# Patient Record
Sex: Female | Born: 1980 | Race: Black or African American | Hispanic: No | Marital: Single | Smoking: Current every day smoker
Health system: Southern US, Community
[De-identification: ages and names within clinical notes are randomized; demographics above are authoritative.]

## PROBLEM LIST (undated history)

## (undated) DIAGNOSIS — F410 Panic disorder [episodic paroxysmal anxiety] without agoraphobia: Secondary | ICD-10-CM

---

## 2003-08-17 ENCOUNTER — Emergency Department (HOSPITAL_COMMUNITY): Admission: EM | Admit: 2003-08-17 | Discharge: 2003-08-17 | Payer: Self-pay | Admitting: Emergency Medicine

## 2003-10-13 ENCOUNTER — Emergency Department (HOSPITAL_COMMUNITY): Admission: EM | Admit: 2003-10-13 | Discharge: 2003-10-13 | Payer: Self-pay | Admitting: Emergency Medicine

## 2003-10-31 ENCOUNTER — Emergency Department (HOSPITAL_COMMUNITY): Admission: EM | Admit: 2003-10-31 | Discharge: 2003-10-31 | Payer: Self-pay | Admitting: Family Medicine
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
## 2005-10-20 ENCOUNTER — Emergency Department (HOSPITAL_COMMUNITY): Admission: EM | Admit: 2005-10-20 | Discharge: 2005-10-21 | Payer: Self-pay | Admitting: Emergency Medicine

## 2006-09-03 ENCOUNTER — Emergency Department (HOSPITAL_COMMUNITY): Admission: EM | Admit: 2006-09-03 | Discharge: 2006-09-03 | Payer: Self-pay | Admitting: Emergency Medicine

## 2006-11-16 ENCOUNTER — Emergency Department (HOSPITAL_COMMUNITY): Admission: EM | Admit: 2006-11-16 | Discharge: 2006-11-16 | Payer: Self-pay | Admitting: Emergency Medicine

## 2007-08-03 ENCOUNTER — Emergency Department (HOSPITAL_COMMUNITY): Admission: EM | Admit: 2007-08-03 | Discharge: 2007-08-03 | Payer: Self-pay | Admitting: Emergency Medicine

## 2007-12-27 ENCOUNTER — Emergency Department (HOSPITAL_COMMUNITY): Admission: EM | Admit: 2007-12-27 | Discharge: 2007-12-27 | Payer: Self-pay | Admitting: Family Medicine

## 2008-11-15 ENCOUNTER — Emergency Department (HOSPITAL_COMMUNITY): Admission: EM | Admit: 2008-11-15 | Discharge: 2008-11-15 | Payer: Self-pay | Admitting: Emergency Medicine

## 2009-03-26 ENCOUNTER — Emergency Department (HOSPITAL_COMMUNITY): Admission: EM | Admit: 2009-03-26 | Discharge: 2009-03-26 | Payer: Self-pay | Admitting: Emergency Medicine

## 2009-10-18 IMAGING — CR DG CHEST 2V
2 series · 2 of 2 positions shown · non-contrast
Comparison: None

CLINICAL DATA: Flu-like symptoms.

CHEST - 2 VIEW

[view not recorded (1 of 2)]
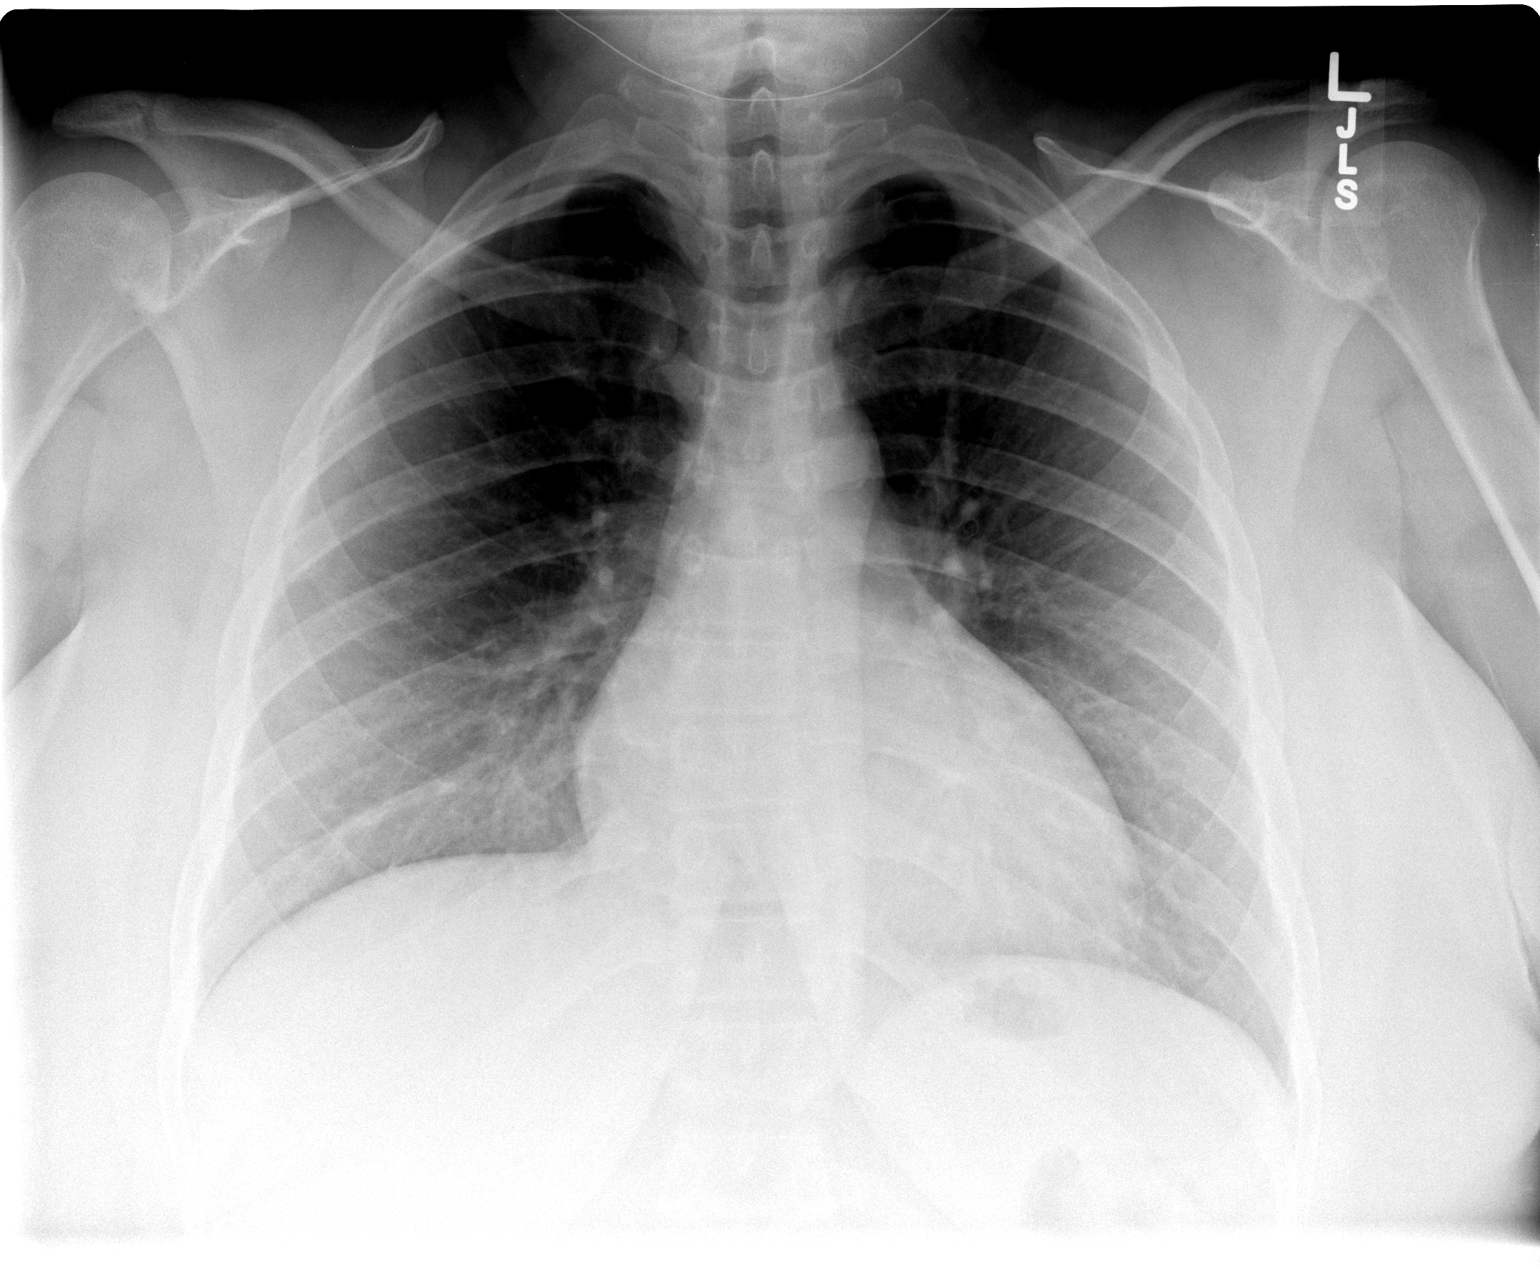

[view not recorded (2 of 2)]
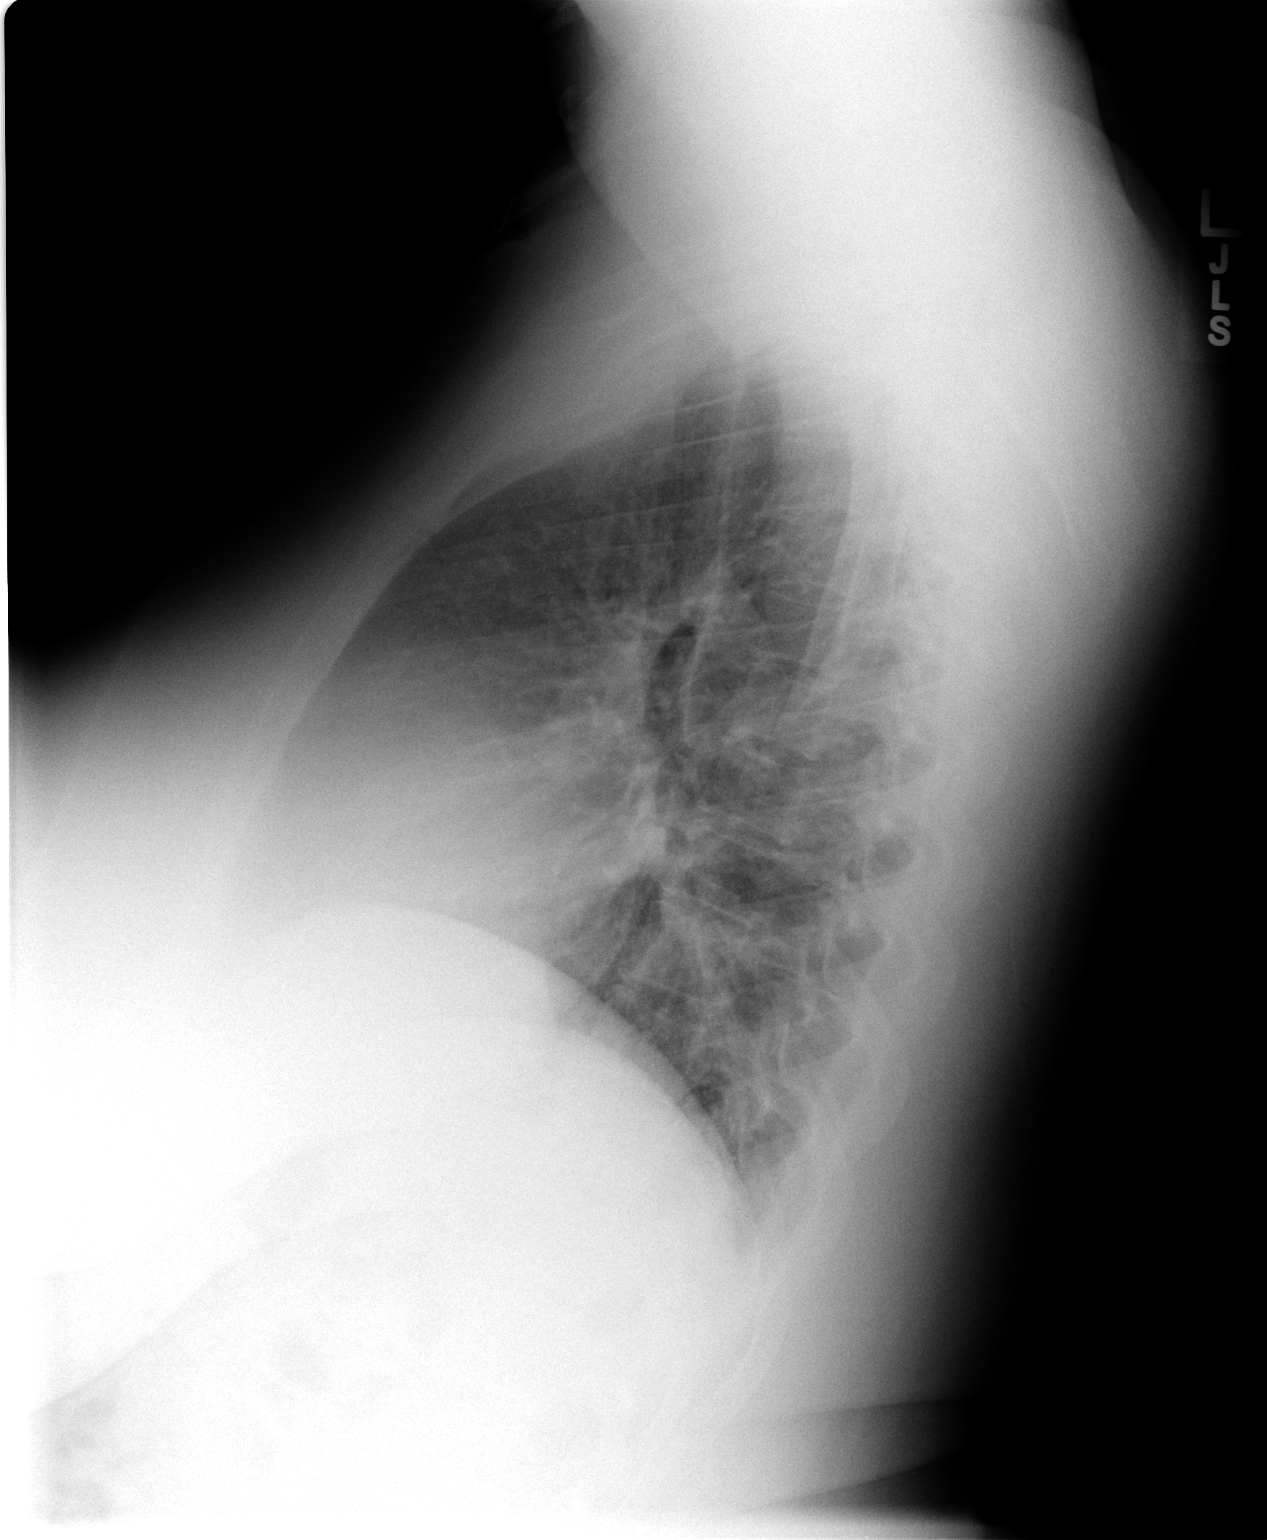

[2 of 2 positions shown; findings below may reference images not displayed]

FINDINGS: The heart size and mediastinal contours are within normal
limits.  Both lungs are clear.  The visualized skeletal structures
are unremarkable.
IMPRESSION: No acute abnormalities.

## 2010-03-20 ENCOUNTER — Inpatient Hospital Stay (HOSPITAL_COMMUNITY): Admission: AD | Admit: 2010-03-20 | Discharge: 2009-04-22 | Payer: Self-pay | Admitting: Obstetrics & Gynecology

## 2013-08-15 ENCOUNTER — Encounter (HOSPITAL_COMMUNITY): Payer: Self-pay | Admitting: Emergency Medicine

## 2013-08-15 ENCOUNTER — Emergency Department (HOSPITAL_COMMUNITY)
Admission: EM | Admit: 2013-08-15 | Discharge: 2013-08-15 | Disposition: A | Discharge status: Home / Self Care | Payer: No Typology Code available for payment source | Attending: Emergency Medicine | Admitting: Emergency Medicine

## 2013-08-15 DIAGNOSIS — IMO0002 Reserved for concepts with insufficient information to code with codable children: Secondary | ICD-10-CM | POA: Insufficient documentation

## 2013-08-15 DIAGNOSIS — M549 Dorsalgia, unspecified: Secondary | ICD-10-CM

## 2013-08-15 DIAGNOSIS — Y92410 Unspecified street and highway as the place of occurrence of the external cause: Secondary | ICD-10-CM | POA: Insufficient documentation

## 2013-08-15 DIAGNOSIS — S161XXA Strain of muscle, fascia and tendon at neck level, initial encounter: Secondary | ICD-10-CM

## 2013-08-15 DIAGNOSIS — S139XXA Sprain of joints and ligaments of unspecified parts of neck, initial encounter: Secondary | ICD-10-CM | POA: Insufficient documentation

## 2013-08-15 DIAGNOSIS — Y9389 Activity, other specified: Secondary | ICD-10-CM | POA: Insufficient documentation

## 2013-08-15 MED ORDER — DIAZEPAM 5 MG PO TABS: 5.0000 mg | ORAL_TABLET | Freq: Two times a day (BID) | ORAL | Status: DC

## 2013-08-15 MED ORDER — DIAZEPAM 5 MG PO TABS
10.0000 mg | ORAL_TABLET | Freq: Once | ORAL | Status: AC
  Administered 2013-08-15: 10 mg via ORAL
  Filled 2013-08-15: qty 2

## 2013-08-15 MED ORDER — NAPROXEN 500 MG PO TABS: 500.0000 mg | ORAL_TABLET | Freq: Two times a day (BID) | ORAL | Status: DC

## 2013-08-15 MED ORDER — NAPROXEN 500 MG PO TABS
500.0000 mg | ORAL_TABLET | Freq: Once | ORAL | Status: AC
  Administered 2013-08-15: 500 mg via ORAL
  Filled 2013-08-15: qty 1
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
## 2013-08-15 NOTE — ED Provider Notes (Signed)
Medical screening examination/treatment/procedure(s) were performed by non-physician practitioner and as supervising physician I was immediately available for consultation/collaboration.   EKG Interpretation None        Benny LennertJoseph L Flemon Kelty, MD 08/15/13 2234
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
## 2013-10-21 ENCOUNTER — Encounter (HOSPITAL_COMMUNITY): Payer: Self-pay | Admitting: Emergency Medicine

## 2013-10-21 ENCOUNTER — Emergency Department (HOSPITAL_COMMUNITY): Payer: No Typology Code available for payment source

## 2013-10-21 ENCOUNTER — Emergency Department (HOSPITAL_COMMUNITY)
Admission: EM | Admit: 2013-10-21 | Discharge: 2013-10-21 | Disposition: A | Discharge status: Home / Self Care | Payer: No Typology Code available for payment source | Attending: Emergency Medicine | Admitting: Emergency Medicine

## 2013-10-21 DIAGNOSIS — F17200 Nicotine dependence, unspecified, uncomplicated: Secondary | ICD-10-CM | POA: Insufficient documentation

## 2013-10-21 DIAGNOSIS — Z8659 Personal history of other mental and behavioral disorders: Secondary | ICD-10-CM | POA: Insufficient documentation

## 2013-10-21 DIAGNOSIS — R0789 Other chest pain: Secondary | ICD-10-CM

## 2013-10-21 DIAGNOSIS — R000 Tachycardia, unspecified: Secondary | ICD-10-CM | POA: Insufficient documentation

## 2013-10-21 DIAGNOSIS — N920 Excessive and frequent menstruation with regular cycle: Secondary | ICD-10-CM | POA: Insufficient documentation

## 2013-10-21 DIAGNOSIS — R002 Palpitations: Secondary | ICD-10-CM | POA: Insufficient documentation

## 2013-10-21 HISTORY — DX: Panic disorder (episodic paroxysmal anxiety): F41.0

## 2013-10-21 LAB — BASIC METABOLIC PANEL
ANION GAP: 15 (ref 5–15)
BUN: 9 mg/dL (ref 6–23)
CALCIUM: 8.7 mg/dL (ref 8.4–10.5)
CO2: 22 meq/L (ref 19–32)
CREATININE: 0.73 mg/dL (ref 0.50–1.10)
Chloride: 104 mEq/L (ref 96–112)
GFR calc Af Amer: >90 mL/min (ref >90)
GFR calc non Af Amer: >90 mL/min (ref >90)
GLUCOSE: 104 mg/dL — AB (ref 70–99)
POTASSIUM: 3.8 meq/L (ref 3.7–5.3)
Sodium: 141 mEq/L (ref 137–147)

## 2013-10-21 LAB — CBC
HEMATOCRIT: 28.7 % — AB (ref 36.0–46.0)
Hemoglobin: 8.1 g/dL — ABNORMAL LOW (ref 12.0–15.0)
MCH: 20.5 pg — ABNORMAL LOW (ref 26.0–34.0)
MCHC: 28.2 g/dL — ABNORMAL LOW (ref 30.0–36.0)
MCV: 72.5 fL — AB (ref 78.0–100.0)
PLATELETS: 639 10*3/uL — AB (ref 150–400)
RBC: 3.96 MIL/uL (ref 3.87–5.11)
RDW: 19.1 % — ABNORMAL HIGH (ref 11.5–15.5)
WBC: 8.9 10*3/uL (ref 4.0–10.5)

## 2013-10-21 LAB — I-STAT TROPONIN, ED
TROPONIN I, POC: 0 ng/mL (ref 0.00–0.08)
Troponin i, poc: 0 ng/mL (ref 0.00–0.08)

## 2013-10-21 LAB — D-DIMER, QUANTITATIVE (NOT AT ARMC): D DIMER QUANT: 0.45 ug{FEU}/mL (ref 0.00–0.48)

## 2013-10-21 MED ORDER — FERROUS SULFATE 325 (65 FE) MG PO TABS: 325.0000 mg | ORAL_TABLET | Freq: Every day | ORAL | Status: AC

## 2013-10-21 NOTE — ED Provider Notes (Signed)
CSN: [id number]     Arrival date & time 10/21/13  1546 History   First MD Initiated Contact with Patient 10/21/13 1751     Chief Complaint  Patient presents with  . Chest Pain     (Consider location/radiation/quality/duration/timing/severity/associated sxs/prior Treatment) HPI Presents with anterior chest pain described as a "fluttering accompanied by mild shortness of breath and palpitations onset 3 PM today. Symptoms improving steadily with time. Patient states she gets chest pain approximately twice per week which is usually made worse with emotional stress and lack of sleep. However "not bad" symptoms are mild at present. No treatment prior to coming here. Patient drinks a lot of caffeinated beverages including Dara LordsMountain Dew Past Medical History  Diagnosis Date  . Panic attacks    break risk factors smoker family history History reviewed. No pertinent past surgical history. History reviewed. No pertinent family history. History  Substance Use Topics  . Smoking status: Current Every Day Smoker    Types: Cigarettes  . Smokeless tobacco: Not on file  . Alcohol Use: No   no drug use OB History   Grav Para Term Preterm Abortions TAB SAB Ect Mult Living                 Review of Systems  Constitutional: Negative.   HENT: Negative.   Respiratory: Positive for shortness of breath.   Cardiovascular: Positive for chest pain and palpitations.  Gastrointestinal: Negative.   Genitourinary: Positive for menstrual problem.       Menorrhagia  Musculoskeletal: Negative.   Skin: Negative.   Neurological: Negative.   Psychiatric/Behavioral: Negative.   All other systems reviewed and are negative.     Allergies  Review of patient's allergies indicates no known allergies.  Home Medications   Prior to Admission medications   Medication Sig Start Date End Date Taking? Authorizing Provider  acetaminophen (TYLENOL) 325 MG tablet Take 650 mg by mouth every 6 (six) hours as needed.    Yes Historical Provider, MD   BP 132/65  Pulse 78  Temp(Src) 98.6 F (37 C) (Oral)  Resp 22  SpO2 100%  LMP 10/15/2013 Physical Exam  Nursing note and vitals reviewed. Constitutional: She appears well-developed and well-nourished.  HENT:  Head: Normocephalic and atraumatic.  Eyes: Conjunctivae are normal. Pupils are equal, round, and reactive to light.  Neck: Neck supple. No tracheal deviation present. No thyromegaly present.  Cardiovascular: Regular rhythm.   No murmur heard. Mildly tachychycardic  Pulmonary/Chest: Effort normal and breath sounds normal.  Abdominal: Soft. Bowel sounds are normal. She exhibits no distension. There is no tenderness.  Musculoskeletal: Normal range of motion. She exhibits no edema and no tenderness.  Neurological: She is alert. Coordination normal.  Skin: Skin is warm and dry. No rash noted.  Psychiatric: She has a normal mood and affect.    ED Course  Procedures (including critical care time) Labs Review Labs Reviewed  CBC - Abnormal; Notable for the following:    Hemoglobin 8.1 (*)    HCT 28.7 (*)    MCV 72.5 (*)    MCH 20.5 (*)    MCHC 28.2 (*)    RDW 19.1 (*)    Platelets 639 (*)    All other components within normal limits  BASIC METABOLIC PANEL - Abnormal; Notable for the following:    Glucose, Bld 104 (*)    All other components within normal limits  Rosezena SensorI-STAT TROPOININ, ED    Imaging Review Dg Chest 2 View  10/21/2013  CLINICAL DATA:  Chest pain for 2 days.  Shortness of breath.  EXAM: CHEST  2 VIEW  COMPARISON:  PA and lateral chest 12/27/2007.  FINDINGS: Heart size and mediastinal contours are within normal limits. Both lungs are clear. Visualized skeletal structures are unremarkable.  IMPRESSION: Negative exam.   Electronically Signed   By: Drusilla Kanner M.D.   On: 10/21/2013 16:26     EKG Interpretation   Date/Time:  Saturday October 21 2013 15:49:55 EDT Ventricular Rate:  111 PR Interval:  146 QRS Duration: 76 QT  Interval:  332 QTC Calculation: 451 R Axis:   90 Text Interpretation:  Sinus tachycardia Rightward axis Nonspecific T wave  abnormality Abnormal ECG No significant change since last tracing  Confirmed by Ethelda Chick  MD, Vandana Haman [phone]) on 10/21/2013 5:56:52 PM     7:40 PM patient asymptomatic Chest x-ray viewed by me Results for orders placed during the hospital encounter of 10/21/13  CBC      Result Value Ref Range   WBC 8.9  4.0 - 10.5 K/uL   RBC 3.96  3.87 - 5.11 MIL/uL   Hemoglobin 8.1 (*) 12.0 - 15.0 g/dL   HCT 60.4 (*) 54.0 - 98.1 %   MCV 72.5 (*) 78.0 - 100.0 fL   MCH 20.5 (*) 26.0 - 34.0 pg   MCHC 28.2 (*) 30.0 - 36.0 g/dL   RDW 19.1 (*) 47.8 - 29.5 %   Platelets 639 (*) 150 - 400 K/uL  BASIC METABOLIC PANEL      Result Value Ref Range   Sodium 141  137 - 147 mEq/L   Potassium 3.8  3.7 - 5.3 mEq/L   Chloride 104  96 - 112 mEq/L   CO2 22  19 - 32 mEq/L   Glucose, Bld 104 (*) 70 - 99 mg/dL   BUN 9  6 - 23 mg/dL   Creatinine, Ser 6.21  0.50 - 1.10 mg/dL   Calcium 8.7  8.4 - 30.8 mg/dL   GFR calc non Af Amer >90  >90 mL/min   GFR calc Af Amer >90  >90 mL/min   Anion gap 15  5 - 15  D-DIMER, QUANTITATIVE      Result Value Ref Range   D-Dimer, Quant 0.45  0.00 - 0.48 ug/mL-FEU  I-STAT TROPOININ, ED      Result Value Ref Range   Troponin i, poc 0.00  0.00 - 0.08 ng/mL   Comment 3           I-STAT TROPOININ, ED      Result Value Ref Range   Troponin i, poc 0.00  0.00 - 0.08 ng/mL   Comment 3            Dg Chest 2 View  10/21/2013   CLINICAL DATA:  Chest pain for 2 days.  Shortness of breath.  EXAM: CHEST  2 VIEW  COMPARISON:  PA and lateral chest 12/27/2007.  FINDINGS: Heart size and mediastinal contours are within normal limits. Both lungs are clear. Visualized skeletal structures are unremarkable.  IMPRESSION: Negative exam.   Electronically Signed   By: Drusilla Kanner M.D.   On: 10/21/2013 16:26    MDM  Heart score equals 1 Final diagnoses:  None   strongly  doubt acute coronary syndrome in this young menstruating female with atypical story, and negative serial troponins and no acute EKG. Doubt pulmonary embolism while score for PE is 1.5 with negative d-dimer Plan prescription for iron sulfate this patient is anemic  Spent 5 minutes counseling the patient on smoking cessation Referral resource guide, wellness Center, [facility] Advised to avoid caffeinated beverages Diagnosis #1 atypical chest pain #2 palpitations #3 anemia #4 tobacco abuse  Doug Sou, MD 10/21/13 1955

## 2013-10-21 NOTE — ED Notes (Signed)
Pt reports onset yesterday of left side sharp chest pains, they resolved and returned again today. Pt reports palpitations and chills, feels anxious. No acute distress noted at triage. Hx of panic attacks.

## 2013-10-21 NOTE — Discharge Instructions (Signed)
Chest Pain (Nonspecific) Call the wellness Center or any of the numbers on the resource guide to get a primary care physician. Ask your new doctor to help you to stop smoking. Call the [facility] getting gynecologist.Tell gynecologist about your heavy menstrual periods. Your blood count should be rechecked in a month. Your hemoglobin was low today at 8.1 It is often hard to give a diagnosis for the cause of chest pain. There is always a chance that your pain could be related to something serious, such as a heart attack or a blood clot in the lungs. You need to follow up with your doctor. HOME CARE  If antibiotic medicine was given, take it as directed by your doctor. Finish the medicine even if you start to feel better.  For the next few days, avoid activities that bring on chest pain. Continue physical activities as told by your doctor.  Do not use any tobacco products. This includes cigarettes, chewing tobacco, and e-cigarettes.  Avoid drinking alcohol.  Only take medicine as told by your doctor.  Follow your doctor's suggestions for more testing if your chest pain does not go away.  Keep all doctor visits you made. GET HELP IF:  Your chest pain does not go away, even after treatment.  You have a rash with blisters on your chest.  You have a fever. GET HELP RIGHT AWAY IF:   You have more pain or pain that spreads to your arm, neck, jaw, back, or belly (abdomen).  You have shortness of breath.  You cough more than usual or cough up blood.  You have very bad back or belly pain.  You feel sick to your stomach (nauseous) or throw up (vomit).  You have very bad weakness.  You pass out (faint).  You have chills. This is an emergency. Do not wait to see if the problems will go away. Call your local emergency services (911 in U.S.). Do not drive yourself to the hospital. MAKE SURE YOU:   Understand these instructions.  Will watch your condition.  Will get help  right away if you are not doing well or get worse. Document Released: 09/16/2007 Document Revised: 04/04/2013 Document Reviewed: 09/16/2007 [employer] Patient Information 2015 [place], [place]. This information is not intended to replace advice given to you by your health care provider. Make sure you discuss any questions you have with your health care provider.  Emergency Department Resource Guide 1) Find a Doctor and Pay Out of Pocket Although you won't have to find out who is covered by your insurance plan, it is a good idea to ask around and get recommendations. You will then need to call the office and see if the doctor you have chosen will accept you as a new patient and what types of options they offer for patients who are self-pay. Some doctors offer discounts or will set up payment plans for their patients who do not have insurance, but you will need to ask so you aren't surprised when you get to your appointment.  2) Contact Your Local Health Department Not all health departments have doctors that can see patients for sick visits, but many do, so it is worth a call to see if yours does. If you don't know where your local health department is, you can check in your phone book. The CDC also has a tool to help you locate your state's health department, and many state websites also have listings of all of their local health departments.  3) Find a Walk-in Clinic If your illness is not likely to be very severe or complicated, you may want to try a walk in clinic. These are popping up all over the country in pharmacies, drugstores, and shopping centers. They're usually staffed by nurse practitioners or physician assistants that have been trained to treat common illnesses and complaints. They're usually fairly quick and inexpensive. However, if you have serious medical issues or chronic medical problems, these are probably not your best option.  No Primary Care Doctor: - Call Health Connect at   [phone] - they can help you locate a primary care doctor that  accepts your insurance, provides certain services, etc. - Physician Referral Service- [phone]  Chronic Pain Problems: Organization         Address  Phone   Notes  [facility]  [phone] Patients need to be referred by their primary care doctor.   Medication Assistance: Organization         Address  Phone   Notes  [facility] Medication [facility] [address] [place], [place] [address] [phone] --Must be a resident of [facility] -- Must have NO insurance coverage whatsoever (no Medicaid/ Medicare, etc.) -- The pt. MUST have a primary care doctor that directs their care regularly and follows them in the community   [facility]ist  [phone]   [employer]  [phone]    Agencies that provide inexpensive medical care: Organization         Address  Phone   Notes  [facility]  [phone]   [facility]    [phone]   [facility] [address] [place], [place] [address] [phone]   [facility] [address] [place] [phone]   Planned Parenthood    [phone]   [facility]    [phone]   [facility]  [address], [place] Phone:  [phone], Fax:  [phone] Hours of Operation:  9 am - 6 pm, M-F.  Also accepts Medicaid/Medicare and self-pay.  [facility]  [address], [place] Phone: [phone], Fax: [phone]. Hours of Operation:  8:30 am - 5:30 pm, M-F.  Also accepts Medicaid and self-pay.  [facility] [place] [address] [place] Phone: [phone]   [facility] [address] Natasha Bence [place], [place] [phone], Ext. [phone] Mondays & Thursdays: 7-9 AM.  First 15 patients are seen on a first come, first serve basis.     Medicaid-accepting [facility] Providers:  Organization         Address  Phone   Notes  [facility] [address],  [phone] Also accepts self-pay patients.  [facility] [address] Laurell Josephs [place], [place]  [phone]   [facility] [address], [place] [phone]   [facility] [address] [place] [phone]   Renaye Rakers [address], [place]   [phone] Only accepts [place] patients after they have their name applied to their card.   Self-Pay (no insurance) in [facility]:  Organization         Address  Phone   Notes  Sickle Cell Patients, [facility] [phone]  Vaughan Basta [place], [place] [phone]   [facility] [address] [place], [place] [phone]   [facility] [place]  [address], [place] [phone]   [facility]/Dr. Osei-Bonsu  [address], [place] or [address], [place] [phone] Phone number for both [place] and [place] locations is the same.  [facility] [address] [place] [phone]   [facility] [address] [place] or [address] [phone]   [facility] [address] [place] [phone], phone; [phone], fax Sees patients 1st and 3rd Saturday of every month.  Must not qualify for public or private insurance (i.e. Medicaid, Medicare, [place] Health Choice, Veterans' Benefits)  Household income should be no more than 200% of the poverty level The clinic cannot treat you if you are pregnant or think you are pregnant  Sexually transmitted diseases are not treated at the clinic.    Dental Care: Organization         Address  Phone  Notes  [facility]  Department of [facility] [address] [place], [place] [phone] Accepts children up to age 56 who are enrolled in [place] or [place] Health Choice; pregnant women with a Medicaid card; and children who have applied for Medicaid or [place] Health Choice, but were declined, whose parents can pay a reduced fee at time of service.  [facility] Department of [facility]  [address], [place] [phone] Accepts children up to age 73 who are enrolled in [place] or [place] Health Choice; pregnant women with a Medicaid card; and children who have applied for Medicaid or [place] Health Choice, but were declined, whose parents can pay a reduced fee at time of service.  [facility]  [address] [place], [place] [phone] Patients are seen by appointment only. Walk-ins are not accepted. [facility] will see patients 70 years of age and older. Monday - Tuesday (8am-5pm) Most Wednesdays (8:30-5pm) $30 per visit, cash only  [facility]  [address], [facility] [phone] Patients are seen by appointment only. Walk-ins are not accepted. [facility] will see patients 58 years of age and older. One Wednesday Evening (Monthly: Volunteer Based).  $30 per visit, cash only  [employer]  [phone] for adults; Children under age 53, call Graduate Pediatric Dentistry at [phone]. Children aged 17-14, please call [phone] to request a pediatric application.  Dental services are provided in all areas of dental care including fillings, crowns and bridges, complete and partial dentures, implants, gum treatment, root canals, and extractions. Preventive care is also provided. Treatment is provided to both adults and children. Patients are selected via a lottery and there is often a waiting list.   [facility] [address] [place]  [phone]  [email or web address]   [facility] [address] [place], [place] [phone], Ext. [phone] Second and Fourth Thursday of each month, opens at 6:30 AM; Clinic ends at 9 AM.  Patients are seen on a first-come first-served basis, and a limited number are seen during each clinic.   [facility]  [address] Ether Griffins [place], [place] [phone]   Eligibility Requirements You must have lived in [place], or [place] counties for at least the last three months.   You cannot be eligible for state or federal sponsored National Cityhealthcare insurance, including CIGNAVeterans Administration, [place]icaid, or [employer]icare.   You generally cannot be eligible for healthcare insurance through your employer.    How to apply: Eligibility screenings are held every Tuesday and Wednesday afternoon from 1:00 pm until 4:00 pm. You do not need an appointment for the interview!  [facility] [address], [place], [place] [phone]   [facility]  [phone]   [facility]  [phone]   [facility]  [phone]    Behavioral Health Resources in the Community: Intensive Outpatient Programs Organization         Address  Phone  Notes  [facility] [address], [place], [place] [phone]   [facility] [address], [place], [place] [phone]   [facility] [address], [place], [place]  [phone]   [facility] [address],  [place], [place] [phone] or [phone]   Substance Abuse Resources Organization         Address  Phone  Notes  [facility]  [phone]   [facility]  [phone]   [facility]  [phone]   [facility]  [phone]   Residential & Outpatient Substance Abuse Program  [phone]   Psychological Services Organization          Address  Phone  Notes  [facility]  [phone]- [phone]   [facility]  [phone]- [phone]   [facility] [address], [place] [phone] or [phone]    Mobile Crisis Teams Organization         Address  Phone  Notes  [facility], Mobile Crisis Care Unit  [phone]   [facility]  [address] [place], [place] [phone]   Doristine LocksSharon DeEsch [address] [place] [phone]    Self-Help/Support Groups Organization         Address  Phone             Notes  [facility] [place] - variety of support groups  336- [phone] Call for more information  Narcotics Anonymous (NA), [facility] [address], [place]  2 meetings at this location   Statisticianesidential Treatment Programs Organization         Address  Phone  Notes  [facility] [address],    [place]  [phone]   [facility]  [address], [place] [phone], [place], [place] [phone]   [facility] [address], [place] [phone] Admissions: 8am-3pm M-F  [facility] [address]    [place], [place] [phone]   [facility] [address], [place], [place] [phone]   [facility] [address]  [place], [place] [phone]   [facility] - Intensive Outpatient [address], [place], [place] [phone]   [facility] (Add[facility] Recovery [facility] [address]  [place], [place] [phone] or [phone]   [facility] (RT[facility]) [address] [place], [place] [phone] Accepts Medicaid  [facility] [address]  [place] [phone] Substance Abuse/Addiction Treatment   [facility] Resources Organization         Address  Phone  Notes  [facility]  [phone]   Angie FavaJulie Brannon, PhD [address], [address] [place], [place]   [phone] or [phone]   [facility]   [address] [place], [place] [phone]   [facility] [address], [place], [place] [phone] Insurance/Medicaid/sponsorship through [facility]  [facility] Families [address]                                    [place], [place] [phone] Therapy/tele-psych/case  [facility] [address]   [place], [place] [phone]    Dr. Lolly MustacheArfeen  [phone]   [facility]  [employer] [facility] 1) [address], [place] 2) [address], [place] 3)  [address], [place] [phone]  [phone]) [phone]   [facility] Child Abuse Hotline [phone] or [phone]) [phone] (After Hours)

## 2014-08-14 ENCOUNTER — Emergency Department (HOSPITAL_COMMUNITY)
Admission: EM | Admit: 2014-08-14 | Discharge: 2014-08-14 | Disposition: A | Discharge status: Home / Self Care | Payer: Self-pay | Attending: Emergency Medicine | Admitting: Emergency Medicine

## 2014-08-14 ENCOUNTER — Encounter (HOSPITAL_COMMUNITY): Payer: Self-pay

## 2014-08-14 DIAGNOSIS — K029 Dental caries, unspecified: Secondary | ICD-10-CM | POA: Insufficient documentation

## 2014-08-14 DIAGNOSIS — Z79899 Other long term (current) drug therapy: Secondary | ICD-10-CM | POA: Insufficient documentation

## 2014-08-14 DIAGNOSIS — Z720 Tobacco use: Secondary | ICD-10-CM | POA: Insufficient documentation

## 2014-08-14 DIAGNOSIS — Z8659 Personal history of other mental and behavioral disorders: Secondary | ICD-10-CM | POA: Insufficient documentation

## 2014-08-14 MED ORDER — NAPROXEN 500 MG PO TABS
500.0000 mg | ORAL_TABLET | Freq: Once | ORAL | Status: DC
  Filled 2014-08-14: qty 1

## 2014-08-14 MED ORDER — PENICILLIN V POTASSIUM 500 MG PO TABS
500.0000 mg | ORAL_TABLET | Freq: Once | ORAL | Status: DC
  Filled 2014-08-14: qty 1

## 2014-08-14 MED ORDER — PENICILLIN V POTASSIUM 500 MG PO TABS: 500.0000 mg | ORAL_TABLET | Freq: Four times a day (QID) | ORAL | Status: AC

## 2014-08-14 MED ORDER — NAPROXEN 375 MG PO TABS: 375.0000 mg | ORAL_TABLET | Freq: Two times a day (BID) | ORAL | Status: AC

## 2014-08-14 NOTE — ED Notes (Signed)
Patient is alert and oriented x3.  She was given DC instructions and follow up visit instructions.  Patient gave verbal understanding. She was DC ambulatory under her own power to home.  V/S stable.  He was not showing any signs of distress on DC 

## 2014-08-14 NOTE — ED Notes (Signed)
Patient reports she has left-sided dental pain x 2 days, with swelling.

## 2014-08-14 NOTE — Discharge Instructions (Signed)
Dental Caries °Dental caries is tooth decay. This decay can cause a hole in teeth (cavity) that can get bigger and deeper over time. °HOME CARE °· Brush and floss your teeth. Do this at least two times a day. °· Use a fluoride toothpaste. °· Use a mouth rinse if told by your dentist or doctor. °· Eat less sugary and starchy foods. Drink less sugary drinks. °· Avoid snacking often on sugary and starchy foods. Avoid sipping often on sugary drinks. °· Keep regular checkups and cleanings with your dentist. °· Use fluoride supplements if told by your dentist or doctor. °· Allow fluoride to be applied to teeth if told by your dentist or doctor. °Document Released: 01/07/2008 Document Revised: 08/14/2013 Document Reviewed: 04/01/2012 °ExitCare® Patient Information ©2015 [employer]. This information is not intended to replace advice given to you by your health care provider. Make sure you discuss any questions you have with your health care provider. ° °Emergency Department Resource Guide °1) Find a Doctor and Pay Out of Pocket °Although you won't have to find out who is covered by your insurance plan, it is a good idea to ask around and get recommendations. You will then need to call the office and see if the doctor you have chosen will accept you as a new patient and what types of options they offer for patients who are self-pay. Some doctors offer discounts or will set up payment plans for their patients who do not have insurance, but you will need to ask so you aren't surprised when you get to your appointment. ° °2) Contact Your Local Health Department °Not all health departments have doctors that can see patients for sick visits, but many do, so it is worth a call to see if yours does. If you don't know where your local health department is, you can check in your phone book. The CDC also has a tool to help you locate your state's health department, and many state websites also have listings of all of their local  health departments. ° °3) Find a Walk-in Clinic °If your illness is not likely to be very severe or complicated, you may want to try a walk in clinic. These are popping up all over the country in pharmacies, drugstores, and shopping centers. They're usually staffed by nurse practitioners or physician assistants that have been trained to treat common illnesses and complaints. They're usually fairly quick and inexpensive. However, if you have serious medical issues or chronic medical problems, these are probably not your best option. ° °No Primary Care Doctor: °- Call Health Connect at  832-8000 - they can help you locate a primary care doctor that  accepts your insurance, provides certain services, etc. °- Physician Referral Service- 1-800-533-3463 ° °Chronic Pain Problems: °Organization         Address  Phone   Notes  °[facility]  (336) 297-2271 Patients need to be referred by their primary care doctor.  ° °Medication Assistance: °Organization         Address  Phone   Notes  °[place] [facility] [address] °[place], [place] [address] (336) 641-8030 --Must be a resident of [place] °-- Must have NO insurance coverage whatsoever (no Medicaid/ Medicare, etc.) °-- The pt. MUST have a primary care doctor that directs their care regularly and follows them in the community °  °[facility]  (866) 331-1348   °[employer]  (888) 892-1162   ° °Agencies that   provide inexpensive medical care: Organization         Address  Phone   Notes  [place]  [phone]   [facility]    [phone]   [facility] [place], [place] [address] [phone]   [place] [address], [place] (941) [phone]   [facility]    979-100-4096   [facility]    [phone]   [place] [address], [place] Phone:   [phone], Fax:  [phone] Hours of Operation:  9 am - 6 pm, M-F.  Also accepts Medicaid/Medicare and self-pay.  [facility] for [place] [address], [address], [place] Phone: [phone], Fax: [phone]. Hours of Operation:  8:30 am - 5:30 pm, M-F.  Also accepts Medicaid and self-pay.  [facility] [place] [address], [place] Phone: [phone]   [place], [place], [place] [phone], Ext. [phone] Mondays & Thursdays: 7-9 AM.  First 15 patients are seen on a first come, first serve basis.    [facility] Providers:  Organization         Address  Phone   Notes  [facility] [address], [place] [phone] Also accepts self-pay patients.  [facility] [address], [place]  [phone]   [place], [address], [place] [phone]   [facility] [address], [place] [phone]   Lucianne Lei [address], [place]   ([phone] Only accepts [place] Access [place] patients after they have their name applied to their card.   Self-Pay (no insurance) in [facility]:  Organization         Address  Phone   Notes  Sickle Cell Patients, [facility] [place] [phone]   [facility] [place] [phone]   [facility] [place]  [place], [place], [place] [phone]   [facility]/Dr. Osei-Bonsu  [address], [place] or [address], [place] [phone] Phone number for both [place] and [place] locations is the same.  [facility] [address], [place] [phone]   [facility] [address] [place] or [address] [phone]   [facility] [address], [place] [phone], phone; [phone], fax Sees patients 1st and 3rd Saturday of every month.  Must not qualify for public or private insurance (i.e. Medicaid, Medicare, [place] Health Choice, Veterans' Benefits)  Household income should be no more than 200% of the poverty level The clinic cannot treat you if you are pregnant or think you are pregnant  Sexually transmitted diseases are not treated at the clinic.    Dental Care: Organization         Address  Phone  Notes  [facility] [place] [phone] Accepts children up to age 50 who are enrolled in [place] or [place]; pregnant women with a Medicaid card; and children who have applied for Medicaid or [place]  Choice, but were declined, whose parents can pay a reduced fee at time of service.  °[place] [facility] [place]  [address], [place] (336) 641-7733 Accepts children up to age 21 who are enrolled in Medicaid or [place] Health Choice; pregnant women with a Medicaid card; and children who have applied for Medicaid or [place] Health Choice, but were declined, whose parents can pay a reduced fee at time of service.  °[facility] ° [address], [place] (336) 641-4533 Patients are seen by appointment only. Walk-ins are not accepted. [facility] will see patients 18 years of age and older. °Monday - Tuesday (8am-5pm) °Most Wednesdays (8:30-5pm) °$30 per visit, cash only  °[facility] ° [address], [place] (336) 641-4533 Patients are seen by appointment only. Walk-ins are not accepted. [facility] will see patients 18 years of age and older. °One Wednesday Evening (Monthly: Volunteer Based).  $30 per visit, cash only  °[facility]  ([phone]) [phone] for adults;  Children under age 4, call Graduate Pediatric Dentistry at [phone]. Children aged 4-14, please call [phone]) [phone] to request a pediatric application. ° Dental services are provided in all areas of dental care including fillings, crowns and bridges, complete and partial dentures, implants, gum treatment, root canals, and extractions. Preventive care is also provided. Treatment is provided to both adults and children. °Patients are selected via a lottery and there is often a waiting list. °  °[facility] [address], °[place] ° (336) 763-8833 [email or web address] °  °[facility] [address], [place], [place] (336)723-1848, Ext. [phone] Second and Fourth Thursday of each month, opens at 6:30 AM; Clinic ends at 9 AM.  Patients are seen on a first-come first-served basis, and a limited number are seen during each clinic.  ° °[facility] ° [address], [place], [place] (336) [phone]   Eligibility Requirements °You must have lived in [place], [place], or [place] counties for at least the last three months. °  You cannot be eligible for state or federal sponsored healthcare insurance, including Veterans Administration, Medicaid, or Medicare. °  You generally cannot be eligible for healthcare insurance through your employer.  °  How to apply: °Eligibility screenings are held every Tuesday and Wednesday afternoon from 1:00 pm until 4:00 pm. You do not need an appointment for the interview!  °[facility] [address], [place], [place] [phone]   °[place] [facility]  336-342-8273   °[place] [facility]  [phone]   °[place] [facility]  336-570-6415   ° °Behavioral Health Resources in the Community: °Intensive Outpatient Programs °Organization         Address  Phone  Notes  °[facility] [address], [place], [place] 336-878-6098   °[facility] [address], [place], [place] 336-832-9800   °[facility] [address], [place], [place] ° 336-882-2125   °[place] [address],  °[place], [place] 1-800-853-5163 or 336-641-4981   °Substance Abuse Resources °Organization         Address  Phone  Notes  °[facility]  336-882-2125   °[facility]  336-784-9470   °[facility]  336-285-9073   °[facility]  336-845-3988   °Residential & Outpatient Substance Abuse Program  1-800-659-3381   °  Psychological Services °Organization         Address  Phone  Notes  °[facility]  336- 832-9600   °[facility]  336- 378-7881   °[facility] [address], [place] 1-800-853-5163 or 336-641-4981   ° °Mobile Crisis Teams °Organization         Address  Phone  Notes  °[facility], Mobile Crisis Care Unit  1-877-626-1772   °Assertive °[facility] ° [address] [place], [place] 336-834-9664   °Sharon DeEsch [address] °[place] 336-554-5454   ° °Self-Help/Support Groups °Organization         Address  Phone             Notes  °[facility] [place] - variety of support groups  336- 373-1402 Call for more information  °Narcotics Anonymous (NA), [facility] [address], °[place]  2 meetings at this location  ° °Residential Treatment Programs °Organization         Address  Phone  Notes  °[facility] [address],    °[place]  1-866-801-8205   °[facility] ° [address], [place], [place] [phone]   °[facility] [address], [place] 336-845-3988 Admissions: 8am-3pm M-F  °[facility] [address]    °[place], [place] 336-841-1104   °[facility] [address], [place], [place] 336-379-7146   °[facility] [address]  °[place], [place] 336-285-9073   °[facility] - Intensive  Outpatient [address], [place], [place] 336-852-3033   °[facility] [address]  °[place], [place] 1-877-615-2722 or 336-784-9470   °[facility] ([facility]) [address] [place], [place] 336-227-7417 Accepts Medicaid  °[facility] [address]  ° [place] 1-800-659-3381 Substance Abuse/Addiction Treatment  ° °[place] [facility] Resources °Organization         Address  Phone  Notes  °[facility]  (888) 581-9988   °Julie Brannon, PhD [address] [place], [place]   (336) 349-5553 or (336) 951-0000   °[facility]   [address] °[place], [place] (336) 349-4454   °[facility] [address], [place], [place] (336) 342-8316 Insurance/Medicaid/sponsorship through [facility]  °[facility] [address]                                    [place], [place] (336) 342-8316 Therapy/tele-psych/case  °[facility] [address]  ° [place], [place] (336) 349-2233    °Dr. Arfeen  (336) 349-4544   °[facility] [place]  [employer] [place] [facility] 1) [address], [place] °2) [address], [place] °3)  [address], [place] (336) 349-3220 °(336) 342-7768 ° °(336) 342-8140   °[place] Child Abuse Hotline (336) 342-1394 or (336) 342-3537 (After Hours)    ° ° ° °

## 2014-08-14 NOTE — ED Provider Notes (Signed)
CSN: [id number]     Arrival date & time 08/14/14  0555 History   First MD Initiated Contact with Patient 08/14/14 [phone]     Chief Complaint  Patient presents with  . Dental Pain     (Consider location/radiation/quality/duration/timing/severity/associated sxs/prior Treatment) Patient is a 34 y.o. female presenting with tooth pain. The history is provided by the patient.  Dental Pain Location:  Lower Lower teeth location:  19/LL 1st molar Quality:  Dull Severity:  Severe Onset quality:  Gradual Duration:  2 days Timing:  Constant Progression:  Unchanged Chronicity:  New Context: dental caries and poor dentition   Context: not trauma   Previous work-up:  Dental exam Relieved by:  Nothing Worsened by:  Nothing tried Ineffective treatments:  None tried Associated symptoms: no congestion, no drooling, no facial swelling, no fever and no trismus   Risk factors: no alcohol problem     Past Medical History  Diagnosis Date  . Panic attacks    History reviewed. No pertinent past surgical history. History reviewed. No pertinent family history. History  Substance Use Topics  . Smoking status: Current Every Day Smoker    Types: Cigarettes  . Smokeless tobacco: Not on file  . Alcohol Use: No   OB History    No data available     Review of Systems  Constitutional: Negative for fever.  HENT: Positive for dental problem. Negative for congestion, drooling and facial swelling.   All other systems reviewed and are negative.     Allergies  Review of patient's allergies indicates no known allergies.  Home Medications   Prior to Admission medications   Medication Sig Start Date End Date Taking? Authorizing Provider  acetaminophen (TYLENOL) 325 MG tablet Take 650 mg by mouth every 6 (six) hours as needed.    Historical Provider, MD  ferrous sulfate 325 (65 FE) MG tablet Take 1 tablet (325 mg total) by mouth daily. 10/21/13   Doug SouSam Jacubowitz, MD   BP 126/69 mmHg  Pulse 85   Temp(Src) 98.9 F (37.2 C) (Oral)  Resp 18  SpO2 100%  LMP 08/10/2014 (Approximate) Physical Exam  Constitutional: She is oriented to person, place, and time. She appears well-developed and well-nourished. No distress.  HENT:  Head: Normocephalic and atraumatic.  Mouth/Throat: No trismus in the jaw. No oropharyngeal exudate.    Eyes: Conjunctivae are normal. Pupils are equal, round, and reactive to light.  Neck: Normal range of motion. Neck supple. No tracheal deviation present.  Cardiovascular: Normal rate and regular rhythm.   Pulmonary/Chest: Effort normal and breath sounds normal. No respiratory distress. She has no wheezes. She has no rales.  Abdominal: Soft. Bowel sounds are normal. There is no tenderness. There is no rebound and no guarding.  Musculoskeletal: Normal range of motion.  Neurological: She is alert and oriented to person, place, and time.  Skin: Skin is warm and dry.  Psychiatric: She has a normal mood and affect.    ED Course  Procedures (including critical care time) Labs Review Labs Reviewed - No data to display  Imaging Review No results found.   EKG Interpretation None      MDM   Final diagnoses:  None   Penicillin VK and referral to dentistry   Crystalann Korf, MD 08/14/14 [phone]

## 2014-10-15 ENCOUNTER — Emergency Department (HOSPITAL_COMMUNITY): Payer: Self-pay

## 2014-10-15 ENCOUNTER — Emergency Department (HOSPITAL_COMMUNITY)
Admission: EM | Admit: 2014-10-15 | Discharge: 2014-10-15 | Disposition: A | Discharge status: Home / Self Care | Payer: Self-pay | Attending: Emergency Medicine | Admitting: Emergency Medicine

## 2014-10-15 ENCOUNTER — Encounter (HOSPITAL_COMMUNITY): Payer: Self-pay | Admitting: Emergency Medicine

## 2014-10-15 DIAGNOSIS — F419 Anxiety disorder, unspecified: Secondary | ICD-10-CM | POA: Insufficient documentation

## 2014-10-15 DIAGNOSIS — M25519 Pain in unspecified shoulder: Secondary | ICD-10-CM | POA: Insufficient documentation

## 2014-10-15 DIAGNOSIS — R079 Chest pain, unspecified: Secondary | ICD-10-CM

## 2014-10-15 DIAGNOSIS — R0789 Other chest pain: Secondary | ICD-10-CM | POA: Insufficient documentation

## 2014-10-15 DIAGNOSIS — Z79899 Other long term (current) drug therapy: Secondary | ICD-10-CM | POA: Insufficient documentation

## 2014-10-15 DIAGNOSIS — Z720 Tobacco use: Secondary | ICD-10-CM | POA: Insufficient documentation

## 2014-10-15 LAB — CBC WITH DIFFERENTIAL/PLATELET
BASOS PCT: 0 % (ref 0–1)
Basophils Absolute: 0 10*3/uL (ref 0.0–0.1)
EOS ABS: 0.3 10*3/uL (ref 0.0–0.7)
Eosinophils Relative: 3 % (ref 0–5)
HEMATOCRIT: 30 % — AB (ref 36.0–46.0)
HEMOGLOBIN: 9 g/dL — AB (ref 12.0–15.0)
LYMPHS PCT: 30 % (ref 12–46)
Lymphs Abs: 2.5 10*3/uL (ref 0.7–4.0)
MCH: 24.9 pg — AB (ref 26.0–34.0)
MCHC: 30 g/dL (ref 30.0–36.0)
MCV: 83.1 fL (ref 78.0–100.0)
Monocytes Absolute: 0.5 10*3/uL (ref 0.1–1.0)
Monocytes Relative: 6 % (ref 3–12)
NEUTROS PCT: 61 % (ref 43–77)
Neutro Abs: 5.2 10*3/uL (ref 1.7–7.7)
PLATELETS: 414 10*3/uL — AB (ref 150–400)
RBC: 3.61 MIL/uL — AB (ref 3.87–5.11)
RDW: 17.5 % — AB (ref 11.5–15.5)
WBC: 8.5 10*3/uL (ref 4.0–10.5)

## 2014-10-15 LAB — BASIC METABOLIC PANEL
Anion gap: 6 (ref 5–15)
BUN: 8 mg/dL (ref 6–20)
CO2: 25 mmol/L (ref 22–32)
CREATININE: 0.73 mg/dL (ref 0.44–1.00)
Calcium: 8.7 mg/dL — ABNORMAL LOW (ref 8.9–10.3)
Chloride: 110 mmol/L (ref 101–111)
GLUCOSE: 104 mg/dL — AB (ref 65–99)
POTASSIUM: 3.6 mmol/L (ref 3.5–5.1)
Sodium: 141 mmol/L (ref 135–145)

## 2014-10-15 LAB — TROPONIN I

## 2014-10-15 MED ORDER — LORAZEPAM 1 MG PO TABS
1.0000 mg | ORAL_TABLET | ORAL | Status: DC | PRN
  Administered 2014-10-15: 1 mg via ORAL
  Filled 2014-10-15: qty 1

## 2014-10-15 MED ORDER — IBUPROFEN 800 MG PO TABS
800.0000 mg | ORAL_TABLET | Freq: Once | ORAL | Status: AC
  Administered 2014-10-15: 800 mg via ORAL
  Filled 2014-10-15: qty 1

## 2014-10-15 NOTE — ED Provider Notes (Signed)
CSN: [id number]     Arrival date & time 10/15/14  1620 History   First MD Initiated Contact with Patient 10/15/14 1635     Chief Complaint  Patient presents with  . Chest Pain      HPI  Patient presents for evaluation with central chest pain. Said intermittent episodes of last several years similar to this. States it feels mostly like her anxiety. Used to be on medication for anxiety "when I saw the psychiatrist". Denies seeing anybody currently. States her arm is sore and uncomfortable and painful to lift the shoulder. Does not feel short of breath. No exertional pain or symptoms. She is a smoker. No family history of heart disease. No history of hypertension diabetes.  Past Medical History  Diagnosis Date  . Panic attacks    History reviewed. No pertinent past surgical history. Family History  Problem Relation Age of Onset  . Heart failure Mother   . Hypertension Mother    History  Substance Use Topics  . Smoking status: Current Every Day Smoker -- 0.50 packs/day    Types: Cigarettes  . Smokeless tobacco: Never Used  . Alcohol Use: Yes     Comment: occas   OB History    No data available     Review of Systems  Constitutional: Negative for fever, chills, diaphoresis, appetite change and fatigue.  HENT: Negative for mouth sores, sore throat and trouble swallowing.   Eyes: Negative for visual disturbance.  Respiratory: Positive for chest tightness. Negative for cough, shortness of breath and wheezing.   Cardiovascular: Positive for chest pain.  Gastrointestinal: Negative for nausea, vomiting, abdominal pain, diarrhea and abdominal distention.  Endocrine: Negative for polydipsia, polyphagia and polyuria.  Genitourinary: Negative for dysuria, frequency and hematuria.  Musculoskeletal: Negative for gait problem.       Shoulder pain  Skin: Negative for color change, pallor and rash.  Neurological: Negative for dizziness, syncope, light-headedness and headaches.   Hematological: Does not bruise/bleed easily.  Psychiatric/Behavioral: Negative for behavioral problems and confusion.       Anxiety      Allergies  Review of patient's allergies indicates no known allergies.  Home Medications   Prior to Admission medications   Medication Sig Start Date End Date Taking? Authorizing Provider  acetaminophen (TYLENOL) 325 MG tablet Take 650 mg by mouth every 6 (six) hours as needed for mild pain or headache.    Yes Historical Provider, MD  ferrous sulfate 325 (65 FE) MG tablet Take 1 tablet (325 mg total) by mouth daily. 10/21/13  Yes Doug SouSam Jacubowitz, MD  Multiple Vitamin (MULTIVITAMIN WITH MINERALS) TABS tablet Take 1 tablet by mouth daily.   Yes Historical Provider, MD  naproxen (NAPROSYN) 375 MG tablet Take 1 tablet (375 mg total) by mouth 2 (two) times daily. Patient not taking: Reported on 10/15/2014 08/14/14   April Palumbo, MD   BP 131/87 mmHg  Pulse 72  Temp(Src) 98 F (36.7 C) (Oral)  Resp 21  Ht 5\' 6"  (1.676 m)  Wt 200 lb (90.719 kg)  BMI 32.30 kg/m2  SpO2 99%  LMP 10/13/2014 Physical Exam  Constitutional: She is oriented to person, place, and time. She appears well-developed and well-nourished. No distress.  HENT:  Head: Normocephalic.  Eyes: Conjunctivae are normal. Pupils are equal, round, and reactive to light. No scleral icterus.  Neck: Normal range of motion. Neck supple. No thyromegaly present.  Cardiovascular: Normal rate and regular rhythm.  Exam reveals no gallop and no friction rub.  No murmur heard. Nontender the chest. Clear lungs. Sinus rhythm on the monitor. No JVD or bruits.  Pulmonary/Chest: Effort normal and breath sounds normal. No respiratory distress. She has no wheezes. She has no rales.  Abdominal: Soft. Bowel sounds are normal. She exhibits no distension. There is no tenderness. There is no rebound.  Musculoskeletal: Normal range of motion.  Neurological: She is alert and oriented to person, place, and time.   Skin: Skin is warm and dry. No rash noted.  Psychiatric: She has a normal mood and affect. Her behavior is normal.  Anxious    ED Course  Procedures (including critical care time) Labs Review Labs Reviewed  CBC WITH DIFFERENTIAL/PLATELET - Abnormal; Notable for the following:    RBC 3.61 (*)    Hemoglobin 9.0 (*)    HCT 30.0 (*)    MCH 24.9 (*)    RDW 17.5 (*)    Platelets 414 (*)    All other components within normal limits  BASIC METABOLIC PANEL - Abnormal; Notable for the following:    Glucose, Bld 104 (*)    Calcium 8.7 (*)    All other components within normal limits  TROPONIN I    Imaging Review Dg Chest 2 View  10/15/2014   CLINICAL DATA:  Central chest pain since this morning. Worse with inspiration. Pain radiates to the left arm.  EXAM: CHEST  2 VIEW  COMPARISON:  10/21/2013  FINDINGS: Heart size is normal. Mediastinal shadows are normal. The lungs are clear. No bronchial thickening. No infiltrate, mass, effusion or collapse. Pulmonary vascularity is normal. No bony abnormality.  IMPRESSION: Normal chest   Electronically Signed   By: Paulina Fusi M.D.   On: 10/15/2014 17:37     EKG Interpretation None      MDM   Final diagnoses:  Chest pain  Anxiety    Reassuring studies. Low cardiac risk score. Left arm pain that is reproducible. Symptoms and findings and her parents most consistent with anxiety. By mouth Ativan given Motrin. Symptoms have resolved. Plan is home.    Rolland Porter, MD 10/15/14 [phone]

## 2014-10-15 NOTE — ED Notes (Signed)
Pt reports centralized chest pain, worse whith inspiration. Pt states the pain radiates down her L arm. Pt denies SOB, nausea or diaphoresis.

## 2014-10-15 NOTE — Discharge Instructions (Signed)
Your testing today shows no abnormalities of your heart or lungs.  Smoking.  Follow-up with primary care physician regarding your Anxiety.

## 2014-11-07 ENCOUNTER — Emergency Department (HOSPITAL_COMMUNITY)
Admission: EM | Admit: 2014-11-07 | Discharge: 2014-11-07 | Disposition: A | Discharge status: Home / Self Care | Payer: Self-pay | Attending: Emergency Medicine | Admitting: Emergency Medicine

## 2014-11-07 ENCOUNTER — Encounter (HOSPITAL_COMMUNITY): Payer: Self-pay | Admitting: Emergency Medicine

## 2014-11-07 DIAGNOSIS — Z8659 Personal history of other mental and behavioral disorders: Secondary | ICD-10-CM | POA: Insufficient documentation

## 2014-11-07 DIAGNOSIS — Z720 Tobacco use: Secondary | ICD-10-CM | POA: Insufficient documentation

## 2014-11-07 DIAGNOSIS — K088 Other specified disorders of teeth and supporting structures: Secondary | ICD-10-CM | POA: Insufficient documentation

## 2014-11-07 DIAGNOSIS — K0889 Other specified disorders of teeth and supporting structures: Secondary | ICD-10-CM

## 2014-11-07 DIAGNOSIS — K029 Dental caries, unspecified: Secondary | ICD-10-CM | POA: Insufficient documentation

## 2014-11-07 DIAGNOSIS — Z79899 Other long term (current) drug therapy: Secondary | ICD-10-CM | POA: Insufficient documentation

## 2014-11-07 MED ORDER — CLINDAMYCIN HCL 150 MG PO CAPS: 300.0000 mg | ORAL_CAPSULE | Freq: Four times a day (QID) | ORAL | Status: AC

## 2014-11-07 MED ORDER — CLINDAMYCIN HCL 150 MG PO CAPS
300.0000 mg | ORAL_CAPSULE | Freq: Once | ORAL | Status: AC
  Administered 2014-11-07: 300 mg via ORAL
  Filled 2014-11-07: qty 2

## 2014-11-07 NOTE — Discharge Instructions (Signed)
Dental Pain  Toothache is pain in or around a tooth. It may get worse with chewing or with cold or heat.   HOME CARE  · Your dentist may use a numbing medicine during treatment. If so, you may need to avoid eating until the medicine wears off. Ask your dentist about this.  · Only take medicine as told by your dentist or doctor.  · Avoid chewing food near the painful tooth until after all treatment is done. Ask your dentist about this.  GET HELP RIGHT AWAY IF:   · The problem gets worse or new problems appear.  · You have a fever.  · There is redness and puffiness (swelling) of the face, jaw, or neck.  · You cannot open your mouth.  · There is pain in the jaw.  · There is very bad pain that is not helped by medicine.  MAKE SURE YOU:   · Understand these instructions.  · Will watch your condition.  · Will get help right away if you are not doing well or get worse.  Document Released: 09/16/2007 Document Revised: 06/22/2011 Document Reviewed: 09/16/2007  ExitCare® Patient Information ©2015 [employer]. This information is not intended to replace advice given to you by your health care provider. Make sure you discuss any questions you have with your health care provider.

## 2014-11-07 NOTE — ED Provider Notes (Signed)
CSN: [id number]     Arrival date & time 11/07/14  1726 History   First MD Initiated Contact with Patient 11/07/14 1758     Chief Complaint  Patient presents with  . Dental Pain     (Consider location/radiation/quality/duration/timing/severity/associated sxs/prior Treatment) HPI   ELSPETH BLUCHER is a 34 y.o. female who presents to the Emergency Department complaining of dental pain for 1-2 days.  She states that a portion f her tooth broke while eating.  Describes a dull throbbing pain to her left lower tooth.  Pain is worse with chewing .  She has been taking naprosyn with significant relief.  She also notices some swelling to her left face.  She denies fever, chills, vomiting, neck pain or difficulty opening and closing her mouth.     Past Medical History  Diagnosis Date  . Panic attacks    History reviewed. No pertinent past surgical history. Family History  Problem Relation Age of Onset  . Heart failure Mother   . Hypertension Mother    History  Substance Use Topics  . Smoking status: Current Every Day Smoker -- 0.50 packs/day    Types: Cigarettes  . Smokeless tobacco: Never Used  . Alcohol Use: Yes     Comment: occas   OB History    No data available     Review of Systems  Constitutional: Negative for fever and appetite change.  HENT: Positive for dental problem. Negative for congestion, facial swelling, sore throat and trouble swallowing.   Eyes: Negative for pain and visual disturbance.  Musculoskeletal: Negative for neck pain and neck stiffness.  Neurological: Negative for dizziness, facial asymmetry and headaches.  Hematological: Negative for adenopathy.  All other systems reviewed and are negative.     Allergies  Review of patient's allergies indicates no known allergies.  Home Medications   Prior to Admission medications   Medication Sig Start Date End Date Taking? Authorizing Provider  acetaminophen (TYLENOL) 325 MG tablet Take 650 mg by mouth every  6 (six) hours as needed for mild pain or headache.     Historical Provider, MD  ferrous sulfate 325 (65 FE) MG tablet Take 1 tablet (325 mg total) by mouth daily. 10/21/13   Doug Sou, MD  Multiple Vitamin (MULTIVITAMIN WITH MINERALS) TABS tablet Take 1 tablet by mouth daily.    Historical Provider, MD  naproxen (NAPROSYN) 375 MG tablet Take 1 tablet (375 mg total) by mouth 2 (two) times daily. Patient not taking: Reported on 10/15/2014 08/14/14   April Palumbo, MD   BP 136/80 mmHg  Pulse 74  Temp(Src) 98.9 F (37.2 C) (Oral)  Resp 18  Ht 5\' 6"  (1.676 m)  Wt 280 lb (127.007 kg)  BMI 45.21 kg/m2  SpO2 100%  LMP 10/13/2014 Physical Exam  Constitutional: She is oriented to person, place, and time. She appears well-developed and well-nourished. No distress.  HENT:  Head: Normocephalic and atraumatic.  Right Ear: Tympanic membrane and ear canal normal.  Left Ear: Tympanic membrane and ear canal normal.  Mouth/Throat: Uvula is midline, oropharynx is clear and moist and mucous membranes are normal. No trismus in the jaw. Dental caries present. No dental abscesses or uvula swelling.  Dental caries of the left lower second molar.  Mild facial swelling, no obvious dental abscess, trismus, or sublingual abnml.    Neck: Normal range of motion. Neck supple.  Cardiovascular: Normal rate, regular rhythm and normal heart sounds.   No murmur heard. Pulmonary/Chest: Effort normal and breath sounds  normal. No respiratory distress.  Musculoskeletal: Normal range of motion.  Lymphadenopathy:    She has no cervical adenopathy.  Neurological: She is alert and oriented to person, place, and time. She exhibits normal muscle tone. Coordination normal.  Skin: Skin is warm and dry.  Nursing note and vitals reviewed.   ED Course  Procedures (including critical care time) Labs Review Labs Reviewed - No data to display  Imaging Review No results found.   EKG Interpretation None      MDM   Final  diagnoses:  Pain, dental    Patient is well-appearing. Vital signs are stable. No clinical symptoms to suggest Ludwig's angina. Patient appears to have a developing periapical abscess, without obvious drainable abscess at this time. She states that she does have a appointment with a dentist tomorrow. I will treat with clindamycin her pain is currently controlled with Naprosyn.    Pauline Aus, PA-C 11/09/14 2012  Samuel Jester, DO 11/12/14 1208

## 2014-11-07 NOTE — ED Notes (Signed)
Pt denies pain,taking naprosyn

## 2014-11-07 NOTE — ED Notes (Signed)
Dental pain lower left, broken tooth. Pt has appointment with [facility] tomorrow, but needs antibiotic today, so maybe they will pull tooth tomorrow
